# Patient Record
Sex: Female | Born: 2000 | Hispanic: Yes | Marital: Married | Smoking: Never smoker
Health system: Southern US, Community
[De-identification: ages and names within clinical notes are randomized; demographics above are authoritative.]

## PROBLEM LIST (undated history)

## (undated) DIAGNOSIS — O99019 Anemia complicating pregnancy, unspecified trimester: Secondary | ICD-10-CM

## (undated) DIAGNOSIS — G43909 Migraine, unspecified, not intractable, without status migrainosus: Secondary | ICD-10-CM

## (undated) HISTORY — DX: Migraine, unspecified, not intractable, without status migrainosus: G43.909

---
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
## 2020-04-03 ENCOUNTER — Ambulatory Visit: Payer: Self-pay

## 2020-04-03 ENCOUNTER — Other Ambulatory Visit: Payer: Self-pay

## 2020-04-03 ENCOUNTER — Ambulatory Visit (LOCAL_COMMUNITY_HEALTH_CENTER): Payer: Self-pay | Admitting: Physician Assistant

## 2020-04-03 ENCOUNTER — Encounter: Payer: Self-pay | Admitting: Physician Assistant

## 2020-04-03 VITALS — BP 116/69 | Ht 61.0 in | Wt 144.0 lb

## 2020-04-03 DIAGNOSIS — Z3046 Encounter for surveillance of implantable subdermal contraceptive: Secondary | ICD-10-CM

## 2020-04-03 DIAGNOSIS — Z3009 Encounter for other general counseling and advice on contraception: Secondary | ICD-10-CM

## 2020-04-03 DIAGNOSIS — Z01419 Encounter for gynecological examination (general) (routine) without abnormal findings: Secondary | ICD-10-CM

## 2020-04-03 MED ORDER — ETONOGESTREL 68 MG ~~LOC~~ IMPL
68.0000 mg | DRUG_IMPLANT | Freq: Once | SUBCUTANEOUS | Status: AC
  Administered 2020-04-03: 68 mg via SUBCUTANEOUS

## 2020-04-03 NOTE — Progress Notes (Signed)
Pt is here for physical and Nexplanon removal and reinsertion. Pt reports has noticed more migraines and has some weight fluctuation but desires to continue with the Nexplanon as her BCM. Pt reports last sex was 03/27/2020 with a condom. Pt denies any sex in the past 7 days without a condom. Nexplanon was placed at [facility] on 09/05/2016 per [facility]. RN counseling for Nexplanon removal and reinsertion completed and consent forms reviewed and signed by pt.

## 2020-04-04 ENCOUNTER — Encounter: Payer: Self-pay | Admitting: Physician Assistant

## 2020-04-04 NOTE — Progress Notes (Signed)
Family Planning Visit- Repeat Yearly Visit  Subjective:  Susan Ortega is a 19 y.o. G1P1001  being seen today for an well woman visit and to discuss family planning options.    She is currently using Nexplanon for pregnancy prevention. Patient reports she does not if she or her partner wants a pregnancy in the next year. Patient  does not have any active problems on file.  Chief Complaint  Patient presents with  . Contraception    Physical and Nexplanon removal and reinsertion    Patient reports that she would like to remove and replace her Nexplanon today.  Reports that she has a history of migraine headaches with patient on usually on left side, dizziness, nausea and sometimes vision loss.  States that she does use IB and that usually this will relieve her headaches.  Reports that she also sometimes has dizziness and nausea that is not associated with her headaches but denies any other associated symptoms.  States that she has bruised easily for several months.  Concerned about weight fluctuation.  Patient denies any other concerns today.   See flowsheet for other program required questions.   Body mass index is 27.21 kg/m. - Patient is eligible for diabetes screening based on BMI and age >63?  not applicable HA1C ordered? not applicable  Patient reports 1 partner in last year. Desires STI screening?  No - patient declines.   Has patient been screened once for HCV in the past?  No  No results found for: HCVAB  Does the patient have current of drug use, have a partner with drug use, and/or has been incarcerated since last result? No  If yes-- Screen for HCV through [facility]   Does the patient meet criteria for HBV testing? No  Criteria:  -Household, sexual or needle sharing contact with HBV -History of drug use -HIV positive -Those with known Hep C   Health Maintenance Due  Topic Date Due  . Hepatitis C Screening  Never done  . TETANUS/TDAP  Never done  .  PAP-Cervical Cytology Screening  Never done  . PAP SMEAR-Modifier  Never done  . INFLUENZA VACCINE  Never done    Review of Systems  All other systems reviewed and are negative.   The following portions of the patient's history were reviewed and updated as appropriate: allergies, current medications, past family history, past medical history, past social history, past surgical history and problem list. Problem list updated.  Objective:   Vitals:   04/03/20 1036  BP: 116/69  Weight: 144 lb (65.3 kg)  Height: 5\' 1"  (1.549 m)    Physical Exam Vitals and nursing note reviewed.  Constitutional:      General: She is not in acute distress.    Appearance: Normal appearance.  HENT:     Head: Normocephalic and atraumatic.     Mouth/Throat:     Mouth: Mucous membranes are moist.     Pharynx: Oropharynx is clear. No oropharyngeal exudate or posterior oropharyngeal erythema.  Eyes:     Conjunctiva/sclera: Conjunctivae normal.  Neck:     Thyroid: No thyroid mass, thyromegaly or thyroid tenderness.  Cardiovascular:     Rate and Rhythm: Normal rate and regular rhythm.  Pulmonary:     Effort: Pulmonary effort is normal.     Breath sounds: Normal breath sounds.  Chest:     Breasts:        Right: Normal. No mass, nipple discharge, skin change or tenderness.  Left: Normal. No mass, nipple discharge, skin change or tenderness.  Abdominal:     Palpations: Abdomen is soft. There is no mass.     Tenderness: There is no abdominal tenderness. There is no guarding or rebound.  Genitourinary:    General: Normal vulva.     Rectum: Normal.     Comments: External genitalia/pubic area without nits, lice, edema, erythema, lesions and inguinal adenopathy. Vagina with normal mucosa and discharge. Cervix without visible lesions. Uterus firm, mobile, nt, no masses, no CMT, no adnexal tenderness or fullness. Musculoskeletal:     Cervical back: Neck supple. No tenderness.  Lymphadenopathy:      Cervical: No cervical adenopathy.     Upper Body:     Right upper body: No supraclavicular, axillary or pectoral adenopathy.     Left upper body: No supraclavicular, axillary or pectoral adenopathy.  Skin:    General: Skin is warm and dry.     Findings: No bruising, erythema, lesion or rash.  Neurological:     Mental Status: She is alert and oriented to person, place, and time.  Psychiatric:        Mood and Affect: Mood normal.        Behavior: Behavior normal.        Thought Content: Thought content normal.        Judgment: Judgment normal.       Assessment and Plan:  Susan Ortega is a 19 y.o. female G1P1001 presenting [facility] for an yearly well woman exam/family planning visit  Contraception counseling: Reviewed all forms of birth control options in the tiered based approach. available including abstinence; over the counter/barrier methods; hormonal contraceptive medication including pill, patch, ring, injection,contraceptive implant, ECP; hormonal and nonhormonal IUDs; permanent sterilization options including vasectomy and the various tubal sterilization modalities. Risks, benefits, and typical effectiveness rates were reviewed.  Questions were answered.  Written information was also given to the patient to review.  Patient desires to remove and replace her Nexplanon, this was prescribed for patient. She will follow up in  1 year and prn for surveillance.  She was told to call with any further questions, or with any concerns about this method of contraception.  Emphasized use of condoms 100% of the time for STI prevention.  Patient was not a candidate for ECP today.    1. Encounter for counseling regarding contraception Reviewed with patient normal SE of Nexplanon and when to call clinic for irregular bleeding. Enc condoms with all sex for 10 days after new Nexplanon placement and always for STD protection.   2. Well woman exam with routine  gynecological exam Reviewed with patient healthy habits to maintain general health. Enc regular exercise, well balanced diet and to drink plenty of water to help maintain normal BMI. Enc MVI 1 po daily. Enc to establish with/ follow up with PCP for primary care concerns, age appropriate screenings and illness. Await pap results.  Counseled that RN will call or send a letter once results are back. - Pap IG (Image Guided)  3. Encounter for removal and reinsertion of Nexplanon Nexplanon Removal and Insertion  Patient identified, informed consent performed, consent signed.   Patient does understand that irregular bleeding is a very common side effect of this medication. She was advised to have backup contraception for one week after replacement of the implant. Patient deemed to meet WHO criteria for being reasonably certain she is not pregnant.  Appropriate time out taken. Nexplanon site identified.  Area prepped in usual sterile fashon. 2 ml of 1% lidocaine with epinephrine was used to anesthetize the area at the distal end of the implant. A small stab incision was made right beside the implant on the distal portion. The Nexplanon rod was grasped manually and removed without difficulty. There was minimal blood loss. There were no complications.   Confirmed correct location of insertion site. The insertion site was identified 8-10 cm (3-4 inches) from the medial epicondyle of the humerus and 3-5 cm (1.25-2 inches) posterior to (below) the sulcus (groove) between the biceps and triceps muscles of the patient's left arm. New Nexplanon removed from packaging, Device confirmed in needle, then inserted full length of needle and withdrawn per handbook instructions. Nexplanon was able to palpated in the patient's left arm; patient palpated the insert herself.  There was minimal blood loss. Patient insertion site covered with guaze and a pressure bandage to reduce any bruising. The patient tolerated the procedure  well and was given post procedure instructions.   Nexplanon:   Counseled patient to take OTC analgesic starting as soon as lidocaine starts to wear off and take regularly for at least 48 hr to decrease discomfort.  Specifically to take with food or milk to decrease stomach upset and for IB 600 mg (3 tablets) every 6 hrs; IB 800 mg (4 tablets) every 8 hrs; or Aleve 2 tablets every 12 hrs.   - etonogestrel (NEXPLANON) implant 68 mg     Return in about 1 year (around 04/03/2021) for RP and prn.  No future appointments.  Matt Holmes, PA

## 2020-04-06 LAB — PAP IG (IMAGE GUIDED): PAP Smear Comment: 0

## 2020-07-26 ENCOUNTER — Emergency Department: Payer: No Typology Code available for payment source

## 2020-07-26 ENCOUNTER — Other Ambulatory Visit: Payer: Self-pay

## 2020-07-26 ENCOUNTER — Emergency Department
Admission: EM | Admit: 2020-07-26 | Discharge: 2020-07-26 | Disposition: A | Discharge status: Home / Self Care | Payer: No Typology Code available for payment source | Attending: Emergency Medicine | Admitting: Emergency Medicine

## 2020-07-26 ENCOUNTER — Encounter: Payer: Self-pay | Admitting: Emergency Medicine

## 2020-07-26 DIAGNOSIS — Y92410 Unspecified street and highway as the place of occurrence of the external cause: Secondary | ICD-10-CM | POA: Diagnosis not present

## 2020-07-26 DIAGNOSIS — M25521 Pain in right elbow: Secondary | ICD-10-CM

## 2020-07-26 DIAGNOSIS — M542 Cervicalgia: Secondary | ICD-10-CM | POA: Diagnosis not present

## 2020-07-26 DIAGNOSIS — M25512 Pain in left shoulder: Secondary | ICD-10-CM | POA: Diagnosis not present

## 2020-07-26 LAB — URINALYSIS, COMPLETE (UACMP) WITH MICROSCOPIC
Bacteria, UA: NONE SEEN
Bilirubin Urine: NEGATIVE
Glucose, UA: NEGATIVE mg/dL
Hgb urine dipstick: NEGATIVE
Ketones, ur: NEGATIVE mg/dL
Leukocytes,Ua: NEGATIVE
Nitrite: NEGATIVE
Protein, ur: NEGATIVE mg/dL
Specific Gravity, Urine: 1.017 (ref 1.005–1.030)
pH: 7 (ref 5.0–8.0)

## 2020-07-26 LAB — POC URINE PREG, ED: Preg Test, Ur: NEGATIVE

## 2020-07-26 MED ORDER — METHOCARBAMOL 750 MG PO TABS: 750.0000 mg | ORAL_TABLET | Freq: Four times a day (QID) | ORAL | 0 refills | Status: AC | PRN

## 2020-07-26 MED ORDER — MELOXICAM 15 MG PO TABS: 15.0000 mg | ORAL_TABLET | Freq: Every day | ORAL | 0 refills | Status: AC
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
## 2020-07-26 NOTE — Discharge Instructions (Signed)
Take Tylenol, up to 4x daily as needed. You may also use the Mobic and Robaxin sent to your pharmacy. Return to ER for any worsening.
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
## 2020-09-06 ENCOUNTER — Other Ambulatory Visit: Payer: Self-pay

## 2020-09-06 ENCOUNTER — Encounter: Payer: Self-pay | Admitting: Advanced Practice Midwife

## 2020-09-06 ENCOUNTER — Ambulatory Visit (LOCAL_COMMUNITY_HEALTH_CENTER): Payer: Medicaid Other | Admitting: Advanced Practice Midwife

## 2020-09-06 VITALS — BP 110/71 | HR 73 | Temp 98.8°F | Resp 18 | Ht 62.0 in | Wt 148.4 lb

## 2020-09-06 DIAGNOSIS — Z3046 Encounter for surveillance of implantable subdermal contraceptive: Secondary | ICD-10-CM | POA: Diagnosis not present

## 2020-09-06 DIAGNOSIS — R87619 Unspecified abnormal cytological findings in specimens from cervix uteri: Secondary | ICD-10-CM | POA: Insufficient documentation

## 2020-09-06 DIAGNOSIS — Z23 Encounter for immunization: Secondary | ICD-10-CM | POA: Diagnosis not present

## 2020-09-06 DIAGNOSIS — E663 Overweight: Secondary | ICD-10-CM | POA: Insufficient documentation

## 2020-09-06 DIAGNOSIS — Z3009 Encounter for other general counseling and advice on contraception: Secondary | ICD-10-CM

## 2020-09-06 DIAGNOSIS — R87610 Atypical squamous cells of undetermined significance on cytologic smear of cervix (ASC-US): Secondary | ICD-10-CM

## 2020-09-06 MED ORDER — NORGESTIM-ETH ESTRAD TRIPHASIC 0.18/0.215/0.25 MG-35 MCG PO TABS: 1.0000 | ORAL_TABLET | Freq: Every day | ORAL | 7 refills | Status: AC

## 2020-09-06 NOTE — Progress Notes (Signed)
Contraception/Family Planning VISIT ENCOUNTER NOTE  Subjective:   Susan Ortega is a 20 y.o. SHF nonsmoker G5P1001(4 yo son)  female here for reproductive life counseling.  Desires ocp's for [facility] and wants Nexplanon removal today.   Reports she does not want a pregnancy in the next year. Denies abnormal vaginal bleeding, discharge, pelvic pain, problems with intercourse or other gynecologic concerns. Had Nexplanon removal/reinsertion 04/03/20. States wants removal because of 20 lb wt gain since 03/2020, +cry, increased sleep, +moody, +irritable, anxiety, increased appetite since 05/20/20, bruises easily since this Nexplanon inserted 04/03/20, and had 1 episode of low abdominal cramping x 7 days with spontaneous resolution with menses on 08/31/20.  Denies any cramping or bleeding today. LMP 08/31/20 (light). Last pap 04/03/20 ASCUS. Last sex 08/31/20 without condom; with current partner x 4 years; 1 partner in last 3 mo.    Gynecologic History Patient's last menstrual period was 08/31/2020 (exact date). Contraception: Nexplanon  Health Maintenance Due  Topic Date Due  . Hepatitis C Screening  Never done  . HPV VACCINES (1 - 2-dose series) Never done  . TETANUS/TDAP  Never done     The following portions of the patient's history were reviewed and updated as appropriate: allergies, current medications, past family history, past medical history, past social history, past surgical history and problem list.  Review of Systems Pertinent items are noted in HPI.   Objective:  BP 110/71   Pulse 73   Temp 98.8 F (37.1 C)   Resp 18   Ht 5\' 2"  (1.575 m)   Wt 148 lb 6.4 oz (67.3 kg)   LMP 08/31/2020 (Exact Date)   BMI 27.14 kg/m  Gen: well appearing, NAD HEENT: no scleral icterus CV: RR Lung: Normal WOB Ext: warm well perfused     Assessment and Plan:   Contraception counseling: Reviewed all forms of birth control options in the tiered based approach. available including abstinence; over  the counter/barrier methods; hormonal contraceptive medication including pill, patch, ring, injection,contraceptive implant, ECP; hormonal and nonhormonal IUDs; permanent sterilization options including vasectomy and the various tubal sterilization modalities. Risks, benefits, and typical effectiveness rates were reviewed.  Questions were answered.  Written information was also given to the patient to review.  Patient desires ocp's, this was prescribed for patient. She will follow up in  03/2021 or prn for surveillance.  She was told to call with any further questions, or with any concerns about this method of contraception.  Emphasized use of condoms 100% of the time for STI prevention.  Patient was offered ECP. ECP was not accepted by the patient. ECP counseling was not given - see RN documentation  1. Overweight BMI=27.1   2. Family planning Pt desires ocp's--e rx Tri Sprintec with 7 RF because will need pap and physical 03/2021 May begin taking ocp's today or tomorrow at same time daily Please counsel on need for abstinance/back up condoms next 7 days Please give contact card for 07-15-1971, LCSW Please give primary care MD list to pt - Norgestimate-Ethinyl Estradiol Triphasic (TRI-SPRINTEC) 0.18/0.215/0.25 MG-35 MCG tablet; Take 1 tablet by mouth daily.  Dispense: 28 tablet; Refill: 7  3. Encounter for surveillance of implantable subdermal contraceptive Nexplanon Removal Patient identified, informed consent performed, consent signed.   Appropriate time out taken. Nexplanon site identified.  Area prepped in usual sterile fashon. 3 ml of 1% lidocaine with Epinephrine was used to anesthetize the area at the distal end of the implant and along implant site. A small stab incision  was made right beside the implant on the distal portion.  The Nexplanon rod was grasped using straight hemostats/manual and removed without difficulty.  There was minimal blood loss. There were no complications.   Steri-strips were applied over the small incision.  A pressure bandage was applied to reduce any bruising.  The patient tolerated the procedure well and was given post procedure instructions.   Nexplanon:   Counseled patient to take OTC analgesic starting as soon as lidocaine starts to wear off and take regularly for at least 48 hr to decrease discomfort.  Specifically to take with food or milk to decrease stomach upset and for IB 600 mg (3 tablets) every 6 hrs; IB 800 mg (4 tablets) every 8 hrs; or Aleve 2 tablets every 12 hrs.    4. Atypical squamous cells of undetermined significance on cytologic smear of cervix (ASC-US) Needs repeat pap 03/2021    Please refer to After Visit Summary for other counseling recommendations.   No follow-ups on file.  Alberteen Spindle, CNM [facility] DEPARTMENT

## 2020-09-06 NOTE — Progress Notes (Signed)
Patient here for family planning Nexplanon removal. Patient unsatisfied with hormonal implant. Desires to use oral contraceptions. Education given to patient on administration of oral birth control and importance to take same time every day.    All questions answered.   Condoms given to patient.  Flu vaccine given to patient today and copy of [facility] report.  Marchelle Folks card given to patient. Consents signed for oral BC initiation and consent for Nexplanon removal.  Instructed patient to call later on around October for appointment in November 2022 for PE, pap, and oral BC refill.   Tri-sprintec prescription handed to patient.   Floy Sabina, RN

## 2020-11-21 ENCOUNTER — Other Ambulatory Visit: Payer: Self-pay

## 2020-11-21 ENCOUNTER — Ambulatory Visit (LOCAL_COMMUNITY_HEALTH_CENTER): Payer: Medicaid Other

## 2020-11-21 VITALS — BP 97/65 | Ht 61.0 in | Wt 153.5 lb

## 2020-11-21 DIAGNOSIS — Z3202 Encounter for pregnancy test, result negative: Secondary | ICD-10-CM

## 2020-11-21 LAB — PREGNANCY, URINE: Preg Test, Ur: NEGATIVE

## 2020-11-21 NOTE — Progress Notes (Signed)
UPT negative today. Reports positive home preg test last week. Trying to get pregnant. Reports preg symptoms. Advised to return for preg test if no period in next 2 weeks. Juliene Pina, interpreter. Jerel Shepherd, RN

## 2021-05-01 ENCOUNTER — Telehealth: Payer: Self-pay

## 2021-05-01 NOTE — Telephone Encounter (Signed)
Telephone call to patient today regarding her repeat PAP and PE due 03/2021.  Appointment scheduled for 05/24/2021 at 4 pm (arrival time is 3:45).  Salli Real interpreted the call. Hart Carwin, RN

## 2021-05-24 ENCOUNTER — Other Ambulatory Visit: Payer: Self-pay

## 2021-05-24 ENCOUNTER — Ambulatory Visit (LOCAL_COMMUNITY_HEALTH_CENTER): Payer: Medicaid Other | Admitting: Advanced Practice Midwife

## 2021-05-24 ENCOUNTER — Encounter: Payer: Self-pay | Admitting: Advanced Practice Midwife

## 2021-05-24 VITALS — BP 115/69 | Ht 61.0 in | Wt 151.6 lb

## 2021-05-24 DIAGNOSIS — R87610 Atypical squamous cells of undetermined significance on cytologic smear of cervix (ASC-US): Secondary | ICD-10-CM | POA: Diagnosis not present

## 2021-05-24 DIAGNOSIS — Z3009 Encounter for other general counseling and advice on contraception: Secondary | ICD-10-CM

## 2021-05-24 DIAGNOSIS — Z01419 Encounter for gynecological examination (general) (routine) without abnormal findings: Secondary | ICD-10-CM

## 2021-05-24 NOTE — Progress Notes (Signed)
Pt here for PE and Pap.  Wet mount results reviewed and patient notified of negative results and normal HGB.  A Dental and PCP list given along with Coral Spikes card.

## 2021-05-24 NOTE — Progress Notes (Signed)
Susan Ortega [name] [address] Main Number: [phone]    Family Planning Visit- Initial Visit  Subjective:  Susan Ortega is a 20 y.o. MHF nonsmoker  G1P1001   being seen today for an initial annual visit and to discuss reproductive life planning.  The patient is currently using No Method - Other Reason for pregnancy prevention. Patient reports   does want a pregnancy in the next year.  Patient has the following medical conditions has Overweight BMI=27.1 and Abnormal Pap smear of cervix 04/03/20 ASCUS on their problem list.  Chief Complaint  Patient presents with   Annual Exam    PE and Pap    Patient reports here for physical and repeat pap. Last pap 04/03/20 ASCUS. Last sex yesterday without condom; wants to conceive. Last PE 04/03/20. Nexplanon removed 09/06/20 due to weight gain. LMP 04/30/21 and heavy and long. -cry, +moody, irritable, easily angered, increased sleep, increased appetite, -SI/HI, +anhedonia. Agrees to meet with Kathreen Cosier, LCSW. Living with her parents, her husband, 82 yo son, 2 yo brother. Working 48 hrs/wk and not in school.   Patient denies cigs, vaping, cigars, MJ, ETOH  Body mass index is 28.64 kg/m. - Patient is eligible for diabetes screening based on BMI and age >20?  not applicable HA1C ordered? not applicable  Patient reports 1  partner/s in last year. Desires STI screening?  Yes  Has patient been screened once for HCV in the past?  No  No results found for: HCVAB  Does the patient have current drug use (including MJ), have a partner with drug use, and/or has been incarcerated since last result? No  If yes-- Screen for HCV through [facility]   Does the patient meet criteria for HBV testing? No  Criteria:  -Household, sexual or needle sharing contact with HBV -History of drug use -HIV positive -Those with known Hep C   Health Maintenance Due  Topic Date Due   HPV VACCINES (1 -  2-dose series) Never done   Hepatitis C Screening  Never done   TETANUS/TDAP  Never done   INFLUENZA VACCINE  12/25/2020   PAP SMEAR-Modifier  04/03/2021    Review of Systems  Eyes:  Positive for blurred vision (without h/a never been to eye doctor--counseled to go for exam).  Cardiovascular:  Positive for chest pain (SOB and chest pain in Ortega of chest when she is upset/anxious x 6 mo--referred to primary care MD).  Gastrointestinal:  Positive for constipation (can't have BM--suggestions given).  Neurological:  Positive for headaches (daily always over left eye relieved with sleep and dark room, not relieved with tylenol; -N&V, -audio, +blurry vision).  Psychiatric/Behavioral:  Positive for depression (accepts referral to Kathreen Cosier, LCSW).    The following portions of the patient's history were reviewed and updated as appropriate: allergies, current medications, past family history, past medical history, past social history, past surgical history and problem list. Problem list updated.   See flowsheet for other program required questions.  Objective:   Vitals:   05/24/21 1625  BP: 115/69  Weight: 151 lb 9.6 oz (68.8 kg)  Height: 5\' 1"  (1.549 m)    Physical Exam Constitutional:      Appearance: Normal appearance. She is normal weight.  HENT:     Head: Normocephalic and atraumatic.     Mouth/Throat:     Mouth: Mucous membranes are moist.     Comments: C/o tooth pain but has never been to dentist--counseled  to make apt Eyes:     Conjunctiva/sclera: Conjunctivae normal.  Neck:     Thyroid: No thyroid mass, thyromegaly or thyroid tenderness.  Cardiovascular:     Rate and Rhythm: Normal rate and regular rhythm.  Pulmonary:     Effort: Pulmonary effort is normal.     Breath sounds: Normal breath sounds.  Abdominal:     Palpations: Abdomen is soft.     Comments: Soft, poor tone, c/o sl tenderness suprapubically on deep palpation  Genitourinary:    General: Normal  vulva.     Exam position: Lithotomy position.     Vagina: Vaginal discharge (white creamy leukorrhea, ph<4.5) present.     Cervix: Friability (friable to pap) present.     Uterus: Normal.      Adnexa: Right adnexa normal and left adnexa normal.     Rectum: Normal.  Musculoskeletal:        General: Normal range of motion.     Cervical back: Normal range of motion and neck supple.  Skin:    General: Skin is warm and dry.  Neurological:     Mental Status: She is alert.  Psychiatric:        Mood and Affect: Mood normal.      Assessment and Plan:  [name] is a 20 y.o. female presenting to the [facility] Department for an initial annual wellness/contraceptive visit  Contraception counseling: Reviewed all forms of birth control options in the tiered based approach. available including abstinence; over the counter/barrier methods; hormonal contraceptive medication including pill, patch, ring, injection,contraceptive implant, ECP; hormonal and nonhormonal IUDs; permanent sterilization options including vasectomy and the various tubal sterilization modalities. Risks, benefits, and typical effectiveness rates were reviewed.  Questions were answered.  Written information was also given to the patient to review.  Patient desires No Method - Other Reason, this was prescribed for patient.    The patient will follow up in  prn for surveillance.  The patient was told to call with any further questions, or with any concerns about this method of contraception.  Emphasized use of condoms 100% of the time for STI prevention.  Patient was not offered ECP based on Unprotected sex within past 120 hours and wants to conceive.  ECP was not accepted by the patient. ECP counseling was not given - see RN documentation  1. Family planning Please give pt primary care MD list Please give pt dental list Please give pt contact info for Kathreen Cosier, LCSW Please do PHQ-9 today=13 Treat wet  mount per standing orders Immunization nurse consult Referred to primary care MD for numerous physical c/o  - WET PREP FOR TRICH, YEAST, CLUE - Hemoglobin, venipuncture - Chlamydia/Gonorrhea [facility] - Syphilis Serology, [facility] - HIV [place] - Pap IG (Image Guided)  2. Well woman exam with routine gynecological exam      No follow-ups on file.  No future appointments.  Alberteen Spindle, CNM

## 2021-05-25 LAB — HEMOGLOBIN, FINGERSTICK: Hemoglobin: 13.1 g/dL (ref 11.1–15.9)

## 2021-05-25 LAB — PAP IG (IMAGE GUIDED): PAP Smear Comment: 0

## 2021-05-25 LAB — WET PREP FOR TRICH, YEAST, CLUE
Trichomonas Exam: NEGATIVE
Yeast Exam: NEGATIVE

## 2021-05-29 ENCOUNTER — Ambulatory Visit: Payer: Self-pay | Admitting: *Deleted

## 2021-05-29 NOTE — Telephone Encounter (Signed)
Summary: Possible fever/Body aches/Nauseated and has been vomiting everything she eats.   Pt stated started with fever on the 05/24/21. Pt possible fever may be above 102.00 pt stated she is having body aches, Pt stated she feels nauseated and has been vomiting everything she eats.   Seeking clinical advice.  Needs Spanish Interpreter       Called patient via interpreter [place], [place] #[phone], to review symptoms of fever, body aches, N/V. No answer, unable to leave message , patient doesn't have voicemail set up at this time.

## 2021-05-29 NOTE — Telephone Encounter (Signed)
Using Spanish interpreter Anderson Malta id# [phone], attempted to call pt but VM not set up. No other numbers noted in chart.

## 2021-05-29 NOTE — Telephone Encounter (Signed)
° °  Chief Complaint: fever- pt stated she was afebrile now. Symptoms: productive cough, chills Frequency: frequent cough Pertinent Negatives: Patient denies SOB Disposition: [] ED /[] Urgent Care (no appt availability in office) / [] Appointment(In office/virtual)/ []  [facility]/ [x] Home Care/ [] Refused Recommended Disposition /[]  Mobile Bus/ []  Follow-up with PCP Additional Notes: pt advised to be seen for her cough phlegm at UC- pt has no PCP, care advice given and pt verbalized understanding. Used Spanish interpreter [phone] [place].        Reason for Disposition  [1] COVID-19 diagnosed by positive lab test (e.g., PCR, rapid self-test kit) AND [2] mild symptoms (e.g., cough, fever, others) AND [4] no complications or SOB  Answer Assessment - Initial Assessment Questions 1. COVID-19 DIAGNOSIS: "Who made your COVID-19 diagnosis?" "Was it confirmed by a positive lab test or self-test?" If not diagnosed by a doctor (or NP/PA), ask "Are there lots of cases (community spread) where you live?" Note: See public health department website, if unsure.     *No Answer* 2. COVID-19 EXPOSURE: "Was there any known exposure to COVID before the symptoms began?" CDC Definition of close contact: within 6 feet (2 meters) for a total of 15 minutes or more over a 24-hour period.      *No Answer* 3. ONSET: "When did the COVID-19 symptoms start?"      05/25/21 4. WORST SYMPTOM: "What is your worst symptom?" (e.g., cough, fever, shortness of breath, muscle aches)     fever 5. COUGH: "Do you have a cough?" If Yes, ask: "How bad is the cough?"       yes 6. FEVER: "Do you have a fever?" If Yes, ask: "What is your temperature, how was it measured, and when did it start?"     *No Answer* 7. RESPIRATORY STATUS: "Describe your breathing?" (e.g., shortness of breath, wheezing, unable to speak)      no 8. BETTER-SAME-WORSE: "Are you getting better, staying the same or getting worse compared to  yesterday?"  If getting worse, ask, "In what way?"     better 9. HIGH RISK DISEASE: "Do you have any chronic medical problems?" (e.g., asthma, heart or lung disease, weak immune system, obesity, etc.)     no 10. VACCINE: "Have you had the COVID-19 vaccine?" If Yes, ask: "Which one, how many shots, when did you get it?"       2  shots 2021 11. BOOSTER: "Have you received your COVID-19 booster?" If Yes, ask: "Which one and when did you get it?" no  13. OTHER SYMPTOMS: "Do you have any other symptoms?"  (e.g., chills, fatigue, headache, loss of smell or taste, muscle pain, sore throat)       Loss of taste headache and muscle pain  Protocols used: Coronavirus (COVID-19) Diagnosed or Suspected-A-AH
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
## 2022-05-27 HISTORY — PX: WISDOM TOOTH EXTRACTION: SHX21

## 2022-05-31 ENCOUNTER — Ambulatory Visit: Payer: Medicaid Other

## 2022-08-22 ENCOUNTER — Ambulatory Visit (LOCAL_COMMUNITY_HEALTH_CENTER): Payer: Medicaid Other | Admitting: Family

## 2022-08-22 VITALS — BP 114/71 | Ht 64.0 in | Wt 155.0 lb

## 2022-08-22 DIAGNOSIS — Z3202 Encounter for pregnancy test, result negative: Secondary | ICD-10-CM | POA: Diagnosis not present

## 2022-08-22 DIAGNOSIS — Z309 Encounter for contraceptive management, unspecified: Secondary | ICD-10-CM | POA: Diagnosis not present

## 2022-08-22 DIAGNOSIS — Z113 Encounter for screening for infections with a predominantly sexual mode of transmission: Secondary | ICD-10-CM

## 2022-08-22 DIAGNOSIS — Z3200 Encounter for pregnancy test, result unknown: Secondary | ICD-10-CM

## 2022-08-22 DIAGNOSIS — Z01419 Encounter for gynecological examination (general) (routine) without abnormal findings: Secondary | ICD-10-CM

## 2022-08-22 DIAGNOSIS — Z3169 Encounter for other general counseling and advice on procreation: Secondary | ICD-10-CM

## 2022-08-22 LAB — WET PREP FOR TRICH, YEAST, CLUE
Trichomonas Exam: NEGATIVE
Yeast Exam: NEGATIVE

## 2022-08-22 LAB — PREGNANCY, URINE: Preg Test, Ur: NEGATIVE

## 2022-08-22 LAB — HM HIV SCREENING LAB: HM HIV Screening: NEGATIVE

## 2022-08-22 NOTE — Progress Notes (Signed)
Pt appointment for PE, Pregnancy test, and STI screening. Seen by [facility]. Family planning packet provided. Pregnancy test negative. Wet prep results all negative and reviewed with pt.

## 2022-08-22 NOTE — Progress Notes (Signed)
[facility] [place] Number: [phone]  Family Planning Visit- Repeat Yearly Visit  Subjective:  Susan Ortega is a 22 y.o. G1P1001  being seen today for an annual wellness visit and to discuss contraception options.   The patient is currently using Pregnant/Seeking Pregnancy for pregnancy prevention. Patient does want a pregnancy in the next year.   report they are not looking for a method at this time.   Patient has the following medical problems: has Overweight BMI=28.6 and Abnormal Pap smear of cervix 04/03/20 ASCUS on their problem list.  Chief Complaint  Patient presents with   Contraception    PE and to discuss period changes related to her birth control   Patient reports was given pills at her last FP visit, which caused heavy bleeding and headaches so she stopped them. Now does not need a method, but would like a physical examination and STI testing.  See flowsheet for other program required questions.   Body mass index is 26.61 kg/m. - Patient is eligible for diabetes screening based on BMI> 25 and age >35?  no HA1C ordered? no  Patient reports 1 of partners in last year. Desires STI screening?  Yes   Has patient been screened once for HCV in the past?  No  No results found for: "HCVAB"  Does the patient have current of drug use, have a partner with drug use, and/or has been incarcerated since last result? No  If yes-- Screen for HCV through [facility]   Does the patient meet criteria for HBV testing? No  Criteria:  -Household, sexual or needle sharing contact with HBV -History of drug use -HIV positive -Those with known Hep C   Health Maintenance Due  Topic Date Due   COVID-19 Vaccine (1) Never done   HPV VACCINES (1 - 2-dose series) Never done   Hepatitis C Screening  Never done   DTaP/Tdap/Td (1 - Tdap) Never done   INFLUENZA VACCINE  12/25/2021   PAP SMEAR-Modifier   05/24/2022    Review of Systems  All other systems reviewed and are negative.   The following portions of the patient's history were reviewed and updated as appropriate: allergies, current medications, past family history, past medical history, past social history, past surgical history and problem list. Problem list updated.  Objective:   Vitals:   08/22/22 1440  BP: 114/71  Weight: 155 lb (70.3 kg)  Height: 5\' 4"  (1.626 m)    Physical Exam Vitals reviewed.  Constitutional:      Appearance: Normal appearance.  HENT:     Head: Normocephalic.     Right Ear: External ear normal.     Left Ear: External ear normal.     Nose: Nose normal.     Mouth/Throat:     Lips: Pink.     Mouth: Mucous membranes are moist.     Pharynx: Oropharynx is clear. No oropharyngeal exudate or posterior oropharyngeal erythema.  Neck:     Thyroid: No thyroid mass, thyromegaly or thyroid tenderness.  Cardiovascular:     Rate and Rhythm: Normal rate and regular rhythm.     Pulses: Normal pulses.     Heart sounds: No murmur heard. Pulmonary:     Effort: Pulmonary effort is normal.     Breath sounds: Normal breath sounds. No wheezing.  Abdominal:     General: Abdomen is flat. Bowel sounds are normal.     Palpations: Abdomen is soft.  There is no mass.     Tenderness: There is no abdominal tenderness. There is no rebound.     Hernia: There is no hernia in the left inguinal area or right inguinal area.  Genitourinary:    Pubic Area: No rash or pubic lice.      Labia:        Right: No rash, tenderness or lesion.        Left: No rash, tenderness or lesion.      Vagina: Normal. No vaginal discharge, erythema, tenderness or lesions.     Cervix: No cervical motion tenderness, friability, lesion or erythema.     Uterus: Normal. Not enlarged and not tender.      Adnexa: Right adnexa normal and left adnexa normal.       Right: No mass or tenderness.         Left: No mass or tenderness.       Rectum:  Normal.     Comments:   Musculoskeletal:     Cervical back: Normal range of motion and neck supple. No tenderness.  Lymphadenopathy:     Cervical: No cervical adenopathy.     Right cervical: No superficial, deep or posterior cervical adenopathy.    Left cervical: No superficial, deep or posterior cervical adenopathy.     Upper Body:     Right upper body: No supraclavicular adenopathy.     Left upper body: No supraclavicular adenopathy.     Lower Body: No right inguinal adenopathy. No left inguinal adenopathy.  Skin:    General: Skin is warm and dry.  Neurological:     Mental Status: She is alert and oriented to person, place, and time.  Psychiatric:        Attention and Perception: Attention normal.        Mood and Affect: Mood normal.        Speech: Speech normal.        Behavior: Behavior normal. Behavior is cooperative.     Assessment and Plan:  Susan Ortega is a 22 y.o. female [place] presenting to the [facility] Department for an yearly wellness and contraception visit   Contraception counseling: Reviewed options based on patient desire and reproductive life plan. Patient is interested in Pregnant/Seeking Pregnancy. This was provided to the patient today.  Risks, benefits, and typical effectiveness rates were reviewed.  Questions were answered.  Written information was also given to the patient to review.    The patient will follow up in  1 years for surveillance.  The patient was told to call with any further questions, or with any concerns about this method of contraception.  Emphasized use of condoms 100% of the time for STI prevention.  Patient was assessed for need for ECP, not indicated.  1. Well woman exam Physical examination performed CBE due 04/2024 Pap due 04/2024  2. Encounter for general counseling and advice on procreation Preconceptual Counseling given  3. Encounter for pregnancy test, result unknown PRT negative - Pregnancy,  urine  4. Screening examination for STD (sexually transmitted disease) Will contact with positive results  - Chlamydia/Gonorrhea [facility] - HIV [facility] - Syphilis Serology, [facility] - WET PREP FOR [place]   Return in about 1 year (around 08/22/2023) for Yearly physical.  No future appointments.  Susan Backbone, FNP

## 2024-04-06 ENCOUNTER — Ambulatory Visit: Payer: Self-pay | Admitting: Pediatrics
